# Patient Record
Sex: Male | Born: 1960 | Race: White | Hispanic: No | Marital: Married | Smoking: Current every day smoker
Health system: Southern US, Community
[De-identification: ages and names within clinical notes are randomized; demographics above are authoritative.]

## PROBLEM LIST (undated history)

## (undated) DIAGNOSIS — I10 Essential (primary) hypertension: Secondary | ICD-10-CM

## (undated) DIAGNOSIS — J449 Chronic obstructive pulmonary disease, unspecified: Secondary | ICD-10-CM

## (undated) DIAGNOSIS — E7800 Pure hypercholesterolemia, unspecified: Secondary | ICD-10-CM

## (undated) DIAGNOSIS — I719 Aortic aneurysm of unspecified site, without rupture: Secondary | ICD-10-CM

## (undated) HISTORY — PX: NO PAST SURGERIES: SHX2092

## (undated) HISTORY — DX: Chronic obstructive pulmonary disease, unspecified: J44.9

## (undated) HISTORY — DX: Aortic aneurysm of unspecified site, without rupture: I71.9

## (undated) HISTORY — DX: Essential (primary) hypertension: I10

## (undated) HISTORY — DX: Pure hypercholesterolemia, unspecified: E78.00

---

## 2013-08-14 ENCOUNTER — Ambulatory Visit: Payer: Self-pay | Admitting: Family Medicine

## 2014-05-25 ENCOUNTER — Ambulatory Visit: Payer: Self-pay | Admitting: Pain Medicine

## 2014-07-14 ENCOUNTER — Other Ambulatory Visit: Payer: Self-pay | Admitting: Pain Medicine

## 2014-07-14 ENCOUNTER — Telehealth: Payer: Self-pay | Admitting: *Deleted

## 2014-07-14 DIAGNOSIS — M5412 Radiculopathy, cervical region: Secondary | ICD-10-CM

## 2014-07-14 DIAGNOSIS — M5416 Radiculopathy, lumbar region: Secondary | ICD-10-CM

## 2014-07-14 DIAGNOSIS — M545 Low back pain: Secondary | ICD-10-CM

## 2014-07-14 DIAGNOSIS — M542 Cervicalgia: Secondary | ICD-10-CM

## 2014-07-14 NOTE — Telephone Encounter (Signed)
-----   Message from Anise SalvoAngela R Crowell sent at 07/14/2014  9:56 AM EDT ----- Regarding: Medication for MRI Patient is scheduled for MRI 07/21/14 and will need medication called into [facility] in [place] for claustrophobia   Thank you

## 2014-07-19 NOTE — Telephone Encounter (Signed)
Spoke with patient, given instructions on how to take Valium, instructed to bring a driver to the appt. Valium called in to [facility] [place]

## 2014-07-19 NOTE — Telephone Encounter (Signed)
-----   Message from Ewing SchleinGregory Crisp, MD sent at 07/19/2014  2:51 PM EDT ----- Regarding: RE: Medication for MRI Savaya Hakes, Pleasecall and Valium 5 mg one hour before MRI and 5 mg Valium at the beginning of MRI. She may take Valium 5 mg one hour after the MRI has begun if needed. Please call and Valium 5 mg quantity of 3 ----- Message -----    From: Concepcion Elkena L Sebastiana Wuest, RN    Sent: 07/19/2014  10:58 AM      To: Ewing SchleinGregory Crisp, MD Subject: RE: Medication for MRI                         Dr. Metta Clinesrisp, you saw Mr. Clayton LefortGarnsay on 05-25-14 as a new patient. You ordered a CT, but Angie said he does not meet the criteria for a CT, so it was changed to an MRI. I spoke with the patient, he has claustrophobia problems, requesting meds for the MRI.l ----- Message -----    From: Ewing SchleinGregory Crisp, MD    Sent: 07/14/2014   6:13 PM      To: Concepcion Elkena L Alvia Tory, RN Subject: RE: Medication for MRI                         I do not recognize Fabiano Remsen Do nOT recall ordering MRI. ----- Message -----    From: Concepcion Elkena L Ima Hafner, RN    Sent: 07/14/2014  10:47 AM      To: Ewing SchleinGregory Crisp, MD Subject: FW: Medication for MRI                           ----- Message -----    From: Anise SalvoAngela R Crowell    Sent: 07/14/2014   9:56 AM      To: Pm-Clinical Subject: Medication for MRI                             Patient is scheduled for MRI 07/21/14 and will need medication called into [facility] [place] for [place]austrophobia   Thank you

## 2014-07-21 ENCOUNTER — Ambulatory Visit
Admission: RE | Admit: 2014-07-21 | Discharge: 2014-07-21 | Disposition: A | Discharge status: Home / Self Care | Payer: 59 | Source: Ambulatory Visit | Attending: Pain Medicine | Admitting: Pain Medicine

## 2014-07-21 DIAGNOSIS — M4802 Spinal stenosis, cervical region: Secondary | ICD-10-CM | POA: Insufficient documentation

## 2014-07-21 DIAGNOSIS — M542 Cervicalgia: Secondary | ICD-10-CM | POA: Diagnosis present

## 2014-07-21 DIAGNOSIS — M545 Low back pain: Secondary | ICD-10-CM

## 2014-07-21 DIAGNOSIS — M5412 Radiculopathy, cervical region: Secondary | ICD-10-CM | POA: Diagnosis present

## 2014-07-21 DIAGNOSIS — M5416 Radiculopathy, lumbar region: Secondary | ICD-10-CM

## 2014-07-21 NOTE — Progress Notes (Signed)
Patient said he also had an MRI of his neck. The pain in his lower back is not very bad at this time. Prefers to wait for result of C. Spine MRI before we refer to neurosurgeon or schedule a block.

## 2014-07-21 NOTE — Progress Notes (Signed)
Patient said he also had an MRI of the neck. The pain in his lower back is not very bad at this time. He prefers to wait for the result of the C. Spine MRI before we refer to neurosurgeon or schedule a block.

## 2014-07-22 NOTE — Progress Notes (Signed)
Results of both MRI's read to patient. Does not wish to see neurosurgeon or get procedure at this time. Wants eval appt with Dr. Metta Clinesrisp. Dr. Metta Clinesrisp notified, agrees to see patient for eval.

## 2014-08-26 ENCOUNTER — Ambulatory Visit: Payer: 59 | Admitting: Pain Medicine

## 2014-09-09 ENCOUNTER — Encounter: Payer: Self-pay | Admitting: Pain Medicine

## 2014-09-09 ENCOUNTER — Ambulatory Visit: Payer: 59 | Attending: Pain Medicine | Admitting: Pain Medicine

## 2014-09-09 ENCOUNTER — Telehealth: Payer: Self-pay | Admitting: Pain Medicine

## 2014-09-09 VITALS — BP 130/84 | HR 102 | Temp 98.4°F | Resp 18 | Ht 69.0 in | Wt 140.0 lb

## 2014-09-09 DIAGNOSIS — M5021 Other cervical disc displacement,  high cervical region: Secondary | ICD-10-CM | POA: Insufficient documentation

## 2014-09-09 DIAGNOSIS — M4802 Spinal stenosis, cervical region: Secondary | ICD-10-CM | POA: Insufficient documentation

## 2014-09-09 DIAGNOSIS — M5136 Other intervertebral disc degeneration, lumbar region: Secondary | ICD-10-CM | POA: Insufficient documentation

## 2014-09-09 DIAGNOSIS — M47816 Spondylosis without myelopathy or radiculopathy, lumbar region: Secondary | ICD-10-CM

## 2014-09-09 DIAGNOSIS — M542 Cervicalgia: Secondary | ICD-10-CM | POA: Diagnosis present

## 2014-09-09 DIAGNOSIS — M5030 Other cervical disc degeneration, unspecified cervical region: Secondary | ICD-10-CM | POA: Insufficient documentation

## 2014-09-09 DIAGNOSIS — M79601 Pain in right arm: Secondary | ICD-10-CM | POA: Diagnosis present

## 2014-09-09 DIAGNOSIS — M47812 Spondylosis without myelopathy or radiculopathy, cervical region: Secondary | ICD-10-CM

## 2014-09-09 DIAGNOSIS — M51369 Other intervertebral disc degeneration, lumbar region without mention of lumbar back pain or lower extremity pain: Secondary | ICD-10-CM | POA: Insufficient documentation

## 2014-09-09 DIAGNOSIS — M79602 Pain in left arm: Secondary | ICD-10-CM | POA: Diagnosis present

## 2014-09-09 NOTE — Progress Notes (Signed)
   Subjective:    Patient ID: Christopher Barker, male    DOB: Apr 26, 1960, 54 y.o.   MRN: [id number]  HPI  Patient is 54 year old gentleman [facility] and treatment of pain involving the neck and upper extremity region predominantly patient is with pain involving the lower back and lower extremity region of lesser degree. On today's visit. Reviewed patient's MRI the cervical spine and informed patient that he needed neurosurgical evaluation. We will await neurosurgical is position prior to modifying patient's treatment regimen. The patient was understanding and in agreement with suggested treatment plan. We also discussed patient's general medical condition including his pulmonary condition and patient may not be able to undergo surgical intervention. We will await discussion of patient's condition between the surgeon and patient's pulmonologist and we will proceed with further treatment pending their decision. The patient was understanding and agrees suggested treatment plan     Review of Systems     Objective:   Physical Exam There was tends to palpation of the splenius capitis and occipitalis region of moderate to moderately severe degree. Patient was with limited range of motion of the cervical spine. There was questionable decreased grip strength of the extremities questionable decreased sensation of the C6 dermatomal distribution. Tinel and Phalen's maneuver without increased pain of significant degree. There was minimal tenderness of the acromioclavicular glenohumeral joint region. No crepitus of the thoracic region was noted. Palpation over the lumbar paraspinal muscles lumbar facet region was a tends to palpation of moderate degree. Lateral bending rotation associated with moderate discomfort. Palpation of the lumbar facets reproduce moderate discomfort. Straight leg raising was tolerated to approximately 30 without increase of pain with dorsiflexion  noted. There was negative clonus negative Homans. Abdomen nontender and no costovertebral maintenance noted.       Assessment & Plan:  Degenerative disc disease cervical spine  Multilevel advanced cervical disc and endplate degeneration. Bulky disc extrusion at C3-4 with borderline to mild spinal stenosis. Multifactorial severe left and moderate right C4 foraminal stenosis. Multifactorial mild spinal stenosis from C4-5 2 cc 67 and up to severe bilateral foraminal stenosis  Degenerative disc disease lumbar spine   Plan  Continue present medications  F/U PCP for evaliation of  BP and general medical  condition.  F/U surgical evaluation. Patient scheduled for neurosurgical evaluation of pain in the cervical and upper extremity region at this time.  F/U neurological evaluation  Follow up evaluation with pulmonologist Dr.Choi for evaluation. According to patient patient states that he will be unable to undergo surgery due to his pulmonary condition . We will await decision of patient's surgeon and pulmonologist regarding land of treatment May consider radiofrequency rhizolysis or intraspinal procedures pending response to present treatment and F/U evaluation.  Patient to call [facility] should patient have concerns prior to scheduled return appointment.

## 2014-09-09 NOTE — Patient Instructions (Addendum)
Continue present medications  F/U PCP for evaliation of  BP and general medical  condition.  F/U surgical evaluation. Please get date of your neurosurgical evaluation of your neck and upper extremity region prior to discharge this morning  F/U neurological evaluation  May consider radiofrequency rhizolysis or intraspinal procedures pending response to present treatment and F/U evaluation.  Patient to call [facility] should patient have concerns prior to scheduled return appointment.

## 2014-09-09 NOTE — Telephone Encounter (Signed)
Chris left vmail 09-09-14 at 11:34 stating patient wants to wait for appt with them until he talks to his pcp

## 2014-09-09 NOTE — Progress Notes (Signed)
Safety precautions to be maintained throughout the outpatient stay will include: orient to surroundings, keep bed in low position, maintain call bell within reach at all times, provide assistance with transfer out of bed and ambulation.  

## 2014-09-24 ENCOUNTER — Telehealth: Payer: Self-pay

## 2014-09-24 NOTE — Telephone Encounter (Signed)
Patient called and said that his insurance company, [employer] had sent a request for information regarding his condition. He said he received a letter from them stating they did not get anything and he wants to make sure it was sent. Does anyone know the status of this?

## 2014-09-24 NOTE — Telephone Encounter (Signed)
Patient notified the request was sent to Medical Records. Instructed to call MR to inquire about this.

## 2017-03-21 IMAGING — MR MR LUMBAR SPINE W/O CM
4 of 5 series · 30 of 48 positions shown · non-contrast
Comparison: None.

CLINICAL DATA: Lumbar radiculitis.

EXAM:
MRI LUMBAR SPINE WITHOUT CONTRAST
TECHNIQUE: Multiplanar, multisequence MR imaging of the lumbar spine was
performed. No intravenous contrast was administered.

[Series 2: T2 · sagittal · 4.0mm · 1.02mm/px · 4 of 13 slices shown (1 of 2)]
[im 1/13]
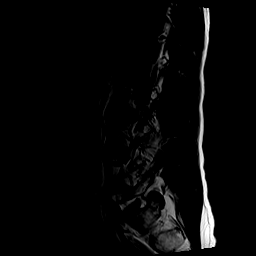
[im 5/13]
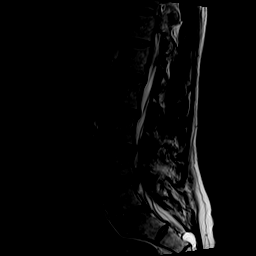
[im 9/13]
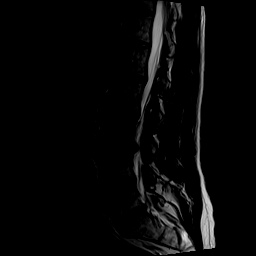
[im 13/13]
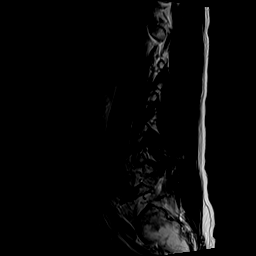

[Series 3: T1 · sagittal · 4.0mm · 0.51mm/px · 5 of 13 slices shown (1 of 2)]
[im 1/13]
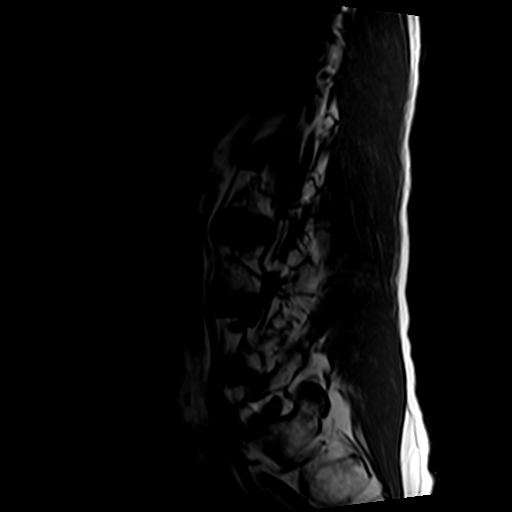
[im 4/13]
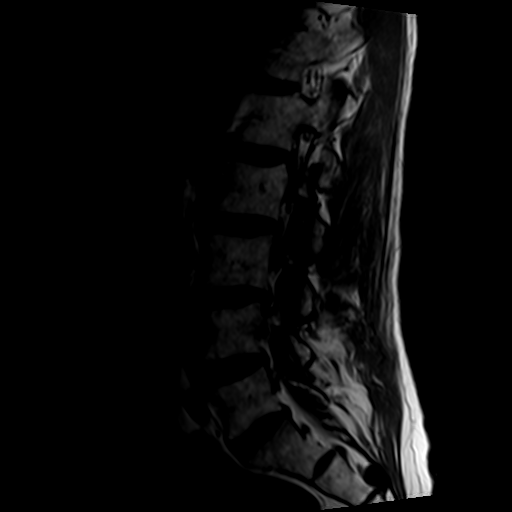
[im 7/13]
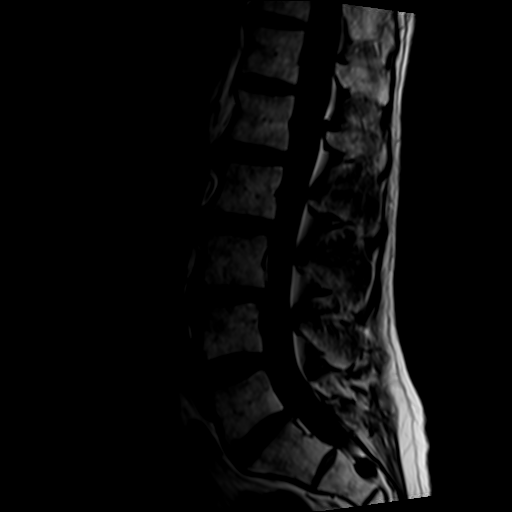
[im 10/13]
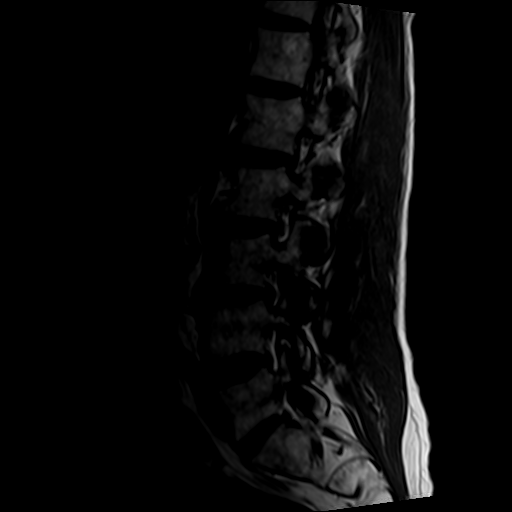
[im 13/13]
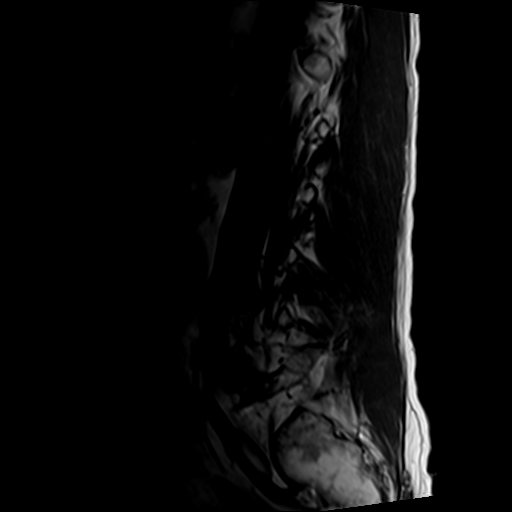

[Series 5: T2 · axial · 4.0mm · 0.78mm/px · z∈[-120,+118]mm · 11 of 46 slices shown (2 of 2)]
[im 3/46]
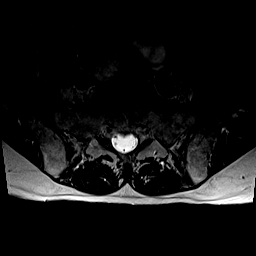
[im 6/46]
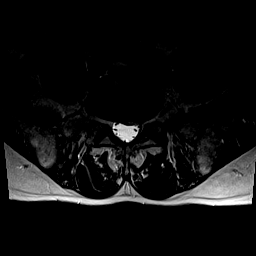
[im 9/46]
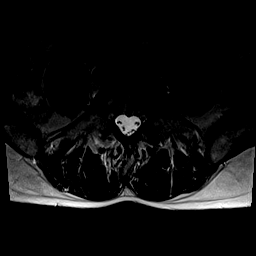
[im 15/46]
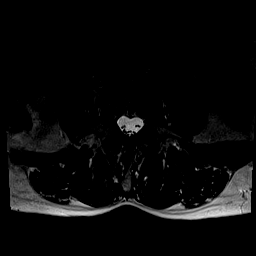
[im 20/46]
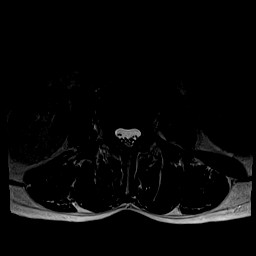
[im 23/46]
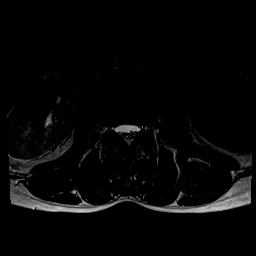
[im 26/46]
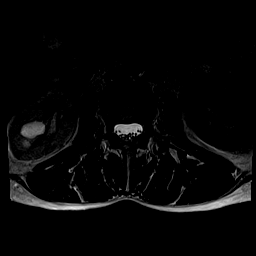
[im 31/46]
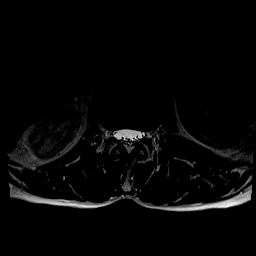
[im 37/46]
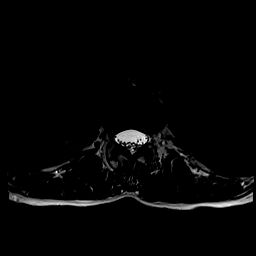
[im 40/46]
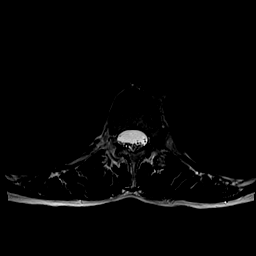
[im 43/46]
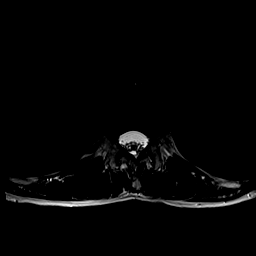

[Series 6: T1 · axial · 4.0mm · 0.39mm/px · z∈[-120,+103]mm · 10 of 46 slices shown (2 of 2)]
[im 3/46]
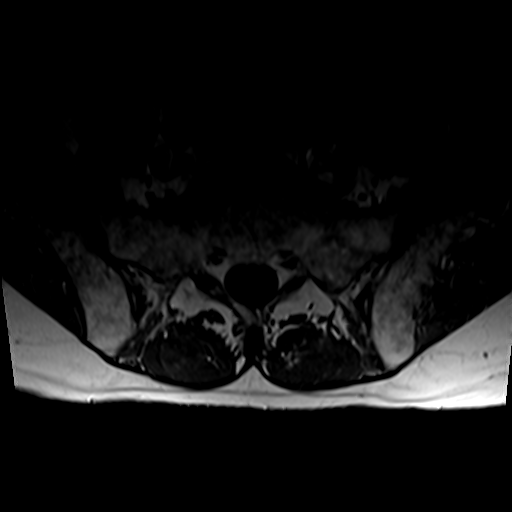
[im 6/46]
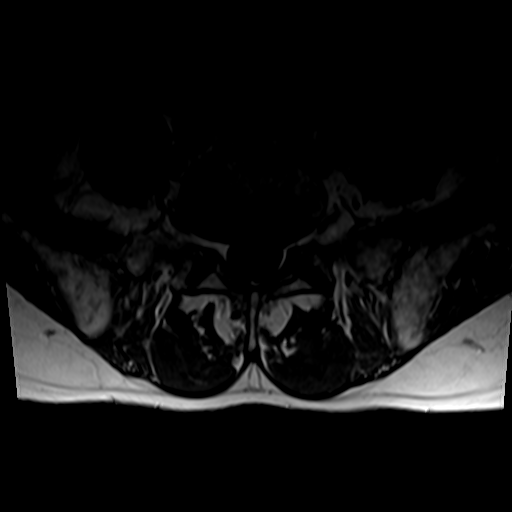
[im 9/46]
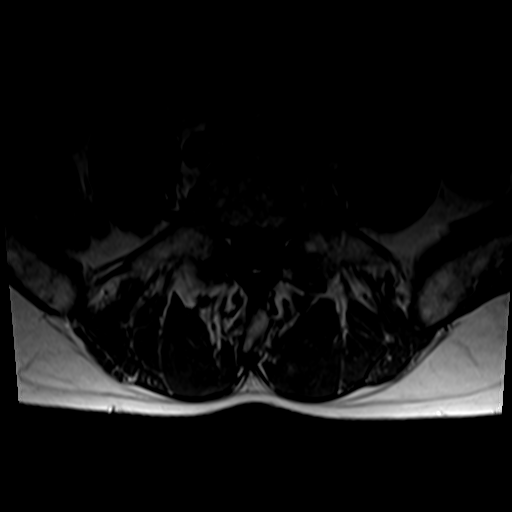
[im 15/46]
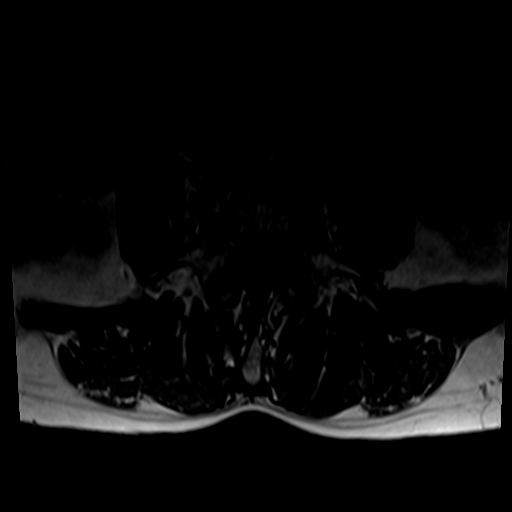
[im 20/46]
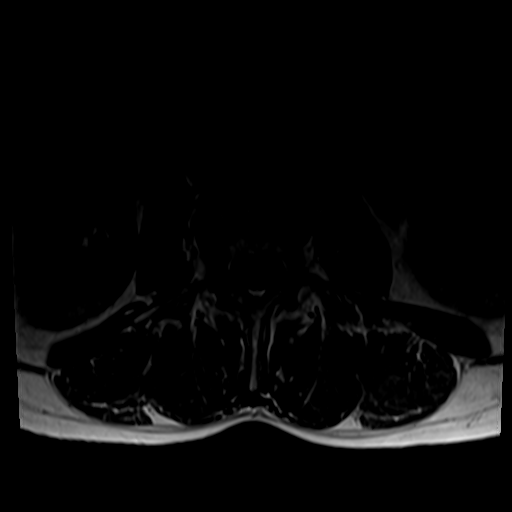
[im 23/46]
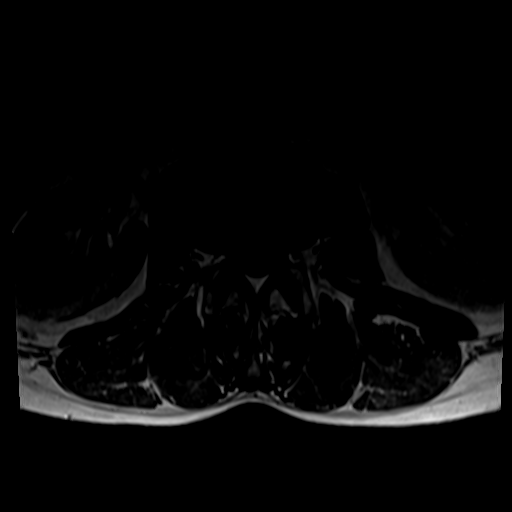
[im 26/46]
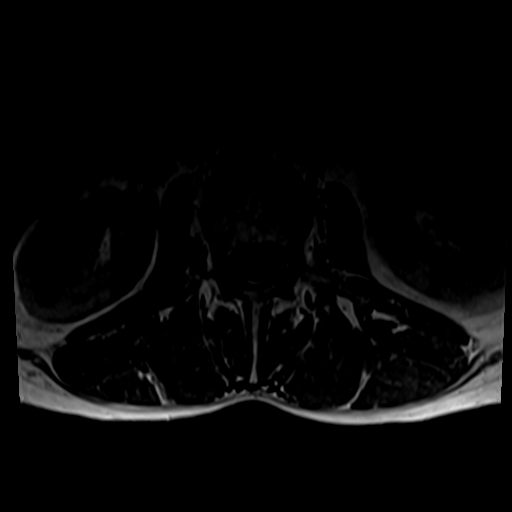
[im 31/46]
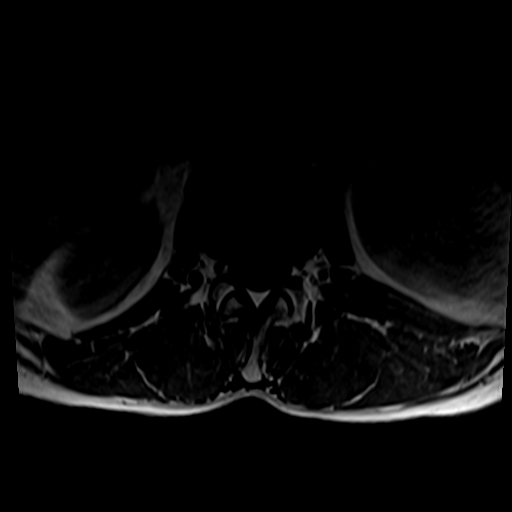
[im 37/46]
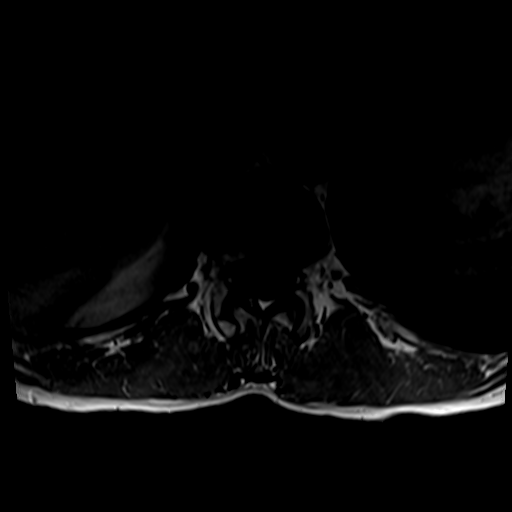
[im 40/46]
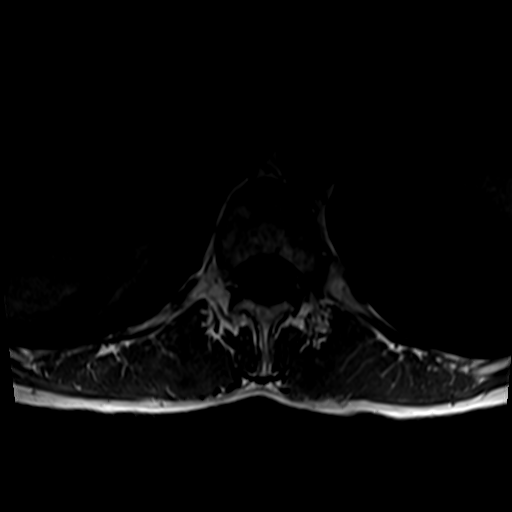

[30 of 48 positions shown; findings below may reference images not displayed]

FINDINGS: Normal lumbar alignment. Negative for fracture or mass. Negative for
bone marrow or soft tissue edema. Tarlov cysts in the sacral canal.
Conus medullaris normal and terminates at T12-L1

L1-2:  Negative

L2-3: Mild disc bulging and early facet degeneration without
significant spinal stenosis.

L3-4: Small left foraminal disc protrusion with narrowing of the
subarticular recess on the left. Neural foramen adequately patent.
Mild facet degeneration and mild spinal stenosis

L4-5:  Mild degenerative change without stenosis

L5-S1: Small central disc protrusion without neural compression or
stenosis
IMPRESSION: Mild spinal stenosis L3-4. Small left foraminal disc protrusion with
narrowing of the subarticular recess on the left.

## 2017-06-03 DEATH — deceased
